# Patient Record
Sex: Male | Born: 1965 | Race: Black or African American | Hispanic: No | Marital: Single | Smoking: Never smoker
Health system: Southern US, Community
[De-identification: ages and names within clinical notes are randomized; demographics above are authoritative.]

---

## 2004-07-22 ENCOUNTER — Emergency Department (HOSPITAL_COMMUNITY): Admission: EM | Admit: 2004-07-22 | Discharge: 2004-07-22 | Payer: Self-pay | Admitting: Emergency Medicine

## 2012-09-09 ENCOUNTER — Encounter (HOSPITAL_COMMUNITY): Payer: Self-pay | Admitting: *Deleted

## 2012-09-09 ENCOUNTER — Emergency Department (HOSPITAL_COMMUNITY): Payer: Self-pay

## 2012-09-09 ENCOUNTER — Emergency Department (HOSPITAL_COMMUNITY)
Admission: EM | Admit: 2012-09-09 | Discharge: 2012-09-09 | Disposition: A | Discharge status: Home / Self Care | Payer: Self-pay | Attending: Emergency Medicine | Admitting: Emergency Medicine

## 2012-09-09 DIAGNOSIS — IMO0001 Reserved for inherently not codable concepts without codable children: Secondary | ICD-10-CM | POA: Insufficient documentation

## 2012-09-09 DIAGNOSIS — M25562 Pain in left knee: Secondary | ICD-10-CM

## 2012-09-09 DIAGNOSIS — M25569 Pain in unspecified knee: Secondary | ICD-10-CM | POA: Insufficient documentation

## 2012-09-09 MED ORDER — ONDANSETRON 4 MG PO TBDP
8.0000 mg | ORAL_TABLET | Freq: Once | ORAL | Status: AC
  Administered 2012-09-09: 8 mg via ORAL
  Filled 2012-09-09: qty 2

## 2012-09-09 MED ORDER — PROMETHAZINE HCL 25 MG PO TABS: 25.0000 mg | ORAL_TABLET | Freq: Four times a day (QID) | ORAL | Status: AC | PRN

## 2012-09-09 MED ORDER — HYDROCODONE-ACETAMINOPHEN 5-325 MG PO TABS
2.0000 | ORAL_TABLET | Freq: Once | ORAL | Status: AC
  Administered 2012-09-09: 2 via ORAL
  Filled 2012-09-09: qty 2

## 2012-09-09 MED ORDER — HYDROCODONE-ACETAMINOPHEN 5-325 MG PO TABS: 2.0000 | ORAL_TABLET | Freq: Four times a day (QID) | ORAL | Status: DC | PRN

## 2012-09-09 NOTE — ED Provider Notes (Signed)
CSN: [id number]     Arrival date & time 09/09/12  0558 History     First MD Initiated Contact with Patient 09/09/12 [phone]     Chief Complaint  Patient presents with  . Knee Pain   (Consider location/radiation/quality/duration/timing/severity/associated sxs/prior Treatment) HPI Comments: Patient is a 47 year old male who presents today with left knee pain since yesterday. He describes the pain as as if someone was hitting him in the knee with a hammer. There was no injury to his knee. 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 Has been ambulatory, but walking increases his pain. He has been icing his knee with no relief. He is never pain like this in the past. He denies fever, chills, nausea, vomiting, abdominal pain, numbness, weakness, paresthesias.  Patient is a 47 y.o. male presenting with knee pain. The history is provided by the patient. No language interpreter was used.  Knee Pain Associated symptoms: no fever     History reviewed. No pertinent past medical history. History reviewed. No pertinent past surgical history. History reviewed. No pertinent family history. History  Substance Use Topics  . Smoking status: Never Smoker   . Smokeless tobacco: Never Used  . Alcohol Use: Yes    Review of Systems  Constitutional: Negative for fever and chills.  Respiratory: Negative for shortness of breath.   Gastrointestinal: Negative for nausea, vomiting and abdominal pain.  Musculoskeletal: Positive for myalgias, arthralgias and gait problem.  Skin: Negative for rash.  All other systems reviewed and are negative.    Allergies  Review of patient's allergies indicates no known allergies.  Home Medications  No current outpatient prescriptions on file. BP 127/85  Pulse 97  Temp(Src) 98.6 F (37 C) (Oral)  Resp 18  SpO2 97% Physical Exam  Nursing note and vitals reviewed. Constitutional: He is oriented to person, place, and  time. He appears well-developed and well-nourished. No distress.  HENT:  Head: Normocephalic and atraumatic.  Right Ear: External ear normal.  Left Ear: External ear normal.  Nose: Nose normal.  Eyes: Conjunctivae are normal.  Neck: Normal range of motion. No tracheal deviation present.  Cardiovascular: Normal rate, regular rhythm and normal heart sounds.   Pulmonary/Chest: Effort normal and breath sounds normal. No stridor.  Abdominal: Soft. He exhibits no distension. There is no tenderness.  Musculoskeletal:       Left knee: He exhibits decreased range of motion. He exhibits no swelling, no effusion, no ecchymosis, no deformity and no erythema. Tenderness found. Patellar tendon tenderness noted.  Neurovascularly intact. Compartment soft. No erythema. Range of motion limited due to pain. Joint stable.   Neurological: He is alert and oriented to person, place, and time.  Skin: Skin is warm and dry. He is not diaphoretic.  Psychiatric: He has a normal mood and affect. His behavior is normal.    ED Course   Procedures (including critical care time)  Labs Reviewed - No data to display Dg Knee Complete 4 Views Left  09/09/2012   *RADIOLOGY REPORT*  Clinical Data: Anterior knee pain  LEFT KNEE - COMPLETE 4+ VIEW  Comparison: None available  Findings: There is no acute fracture or dislocation.  No joint effusion.  Mild narrowing of the medial joint space compartment is present.  Prominent spurring is seen at the superior and inferior poles of the patella.  No chondrocalcinosis.  No soft tissue abnormality.  Osseous mineralization is within normal limits.  IMPRESSION: 1.  No  acute fracture dislocation. 2.  Superior and inferior patellar spurs.   Original Report Authenticated By: Rise Mu, M.D.   1. Left knee pain     MDM  Patient presents with knee pain since yesterday. No injury. Neurovascularly intact. Compartment soft. No concern for septic joint. XR negative for fracture.  Discussed it is likely the superior and inferior patellar spurs are causing his pain. Will give crutches for comfort. Continue to rest, ice, elevate. Small rx for pain medication given. Follow up with ortho as needed. Resource guide given to establish care with pcp. Return instructions given. Vital signs stable for discharge. Patient / Family / Caregiver informed of clinical course, understand medical decision-making process, and agree with plan.   Mora Bellman, PA-C 09/09/12 [phone]

## 2012-09-09 NOTE — ED Provider Notes (Signed)
Medical screening examination/treatment/procedure(s) were performed by non-physician practitioner and as supervising physician I was immediately available for consultation/collaboration.   Sherrill Buikema M Kynnedy Carreno, MD 09/09/12 1638 

## 2012-09-09 NOTE — ED Notes (Signed)
Pt c/o left knee pain. Pt denies any falls, injury/trauma. Pt walking with a limp to pt room.

## 2014-02-03 ENCOUNTER — Emergency Department (HOSPITAL_COMMUNITY)
Admission: EM | Admit: 2014-02-03 | Discharge: 2014-02-03 | Disposition: A | Discharge status: Home / Self Care | Payer: Self-pay | Attending: Emergency Medicine | Admitting: Emergency Medicine

## 2014-02-03 ENCOUNTER — Encounter (HOSPITAL_COMMUNITY): Payer: Self-pay | Admitting: Emergency Medicine

## 2014-02-03 DIAGNOSIS — K0521 Aggressive periodontitis, localized: Secondary | ICD-10-CM | POA: Insufficient documentation

## 2014-02-03 DIAGNOSIS — K05219 Aggressive periodontitis, localized, unspecified severity: Secondary | ICD-10-CM

## 2014-02-03 MED ORDER — HYDROCODONE-ACETAMINOPHEN 5-325 MG PO TABS: 1.0000 | ORAL_TABLET | ORAL | Status: AC | PRN

## 2014-02-03 MED ORDER — PENICILLIN V POTASSIUM 250 MG PO TABS
500.0000 mg | ORAL_TABLET | Freq: Once | ORAL | Status: AC
  Administered 2014-02-03: 500 mg via ORAL
  Filled 2014-02-03: qty 2

## 2014-02-03 MED ORDER — HYDROCODONE-ACETAMINOPHEN 5-325 MG PO TABS
2.0000 | ORAL_TABLET | Freq: Once | ORAL | Status: AC
  Administered 2014-02-03: 2 via ORAL
  Filled 2014-02-03: qty 2

## 2014-02-03 MED ORDER — LIDOCAINE-EPINEPHRINE (PF) 2 %-1:200000 IJ SOLN
20.0000 mL | Freq: Once | INTRAMUSCULAR | Status: AC
  Administered 2014-02-03: 20 mL
  Filled 2014-02-03: qty 20

## 2014-02-03 MED ORDER — PENICILLIN V POTASSIUM 500 MG PO TABS: 500.0000 mg | ORAL_TABLET | Freq: Four times a day (QID) | ORAL | Status: AC

## 2014-02-03 MED ORDER — PREDNISONE 20 MG PO TABS
60.0000 mg | ORAL_TABLET | Freq: Once | ORAL | Status: AC
  Administered 2014-02-03: 60 mg via ORAL
  Filled 2014-02-03: qty 3

## 2014-02-03 NOTE — ED Provider Notes (Signed)
CSN: [id number]     Arrival date & time 02/03/14  [id number] History   First MD Initiated Contact with Patient 02/03/14 [phone]     Chief Complaint  Patient presents with  . Dental Pain    possible abscess      HPI  Patient presents evaluation of dental pain and cheek and gum swelling. He has a left first premolar that is painful. Feels like it is loose. Yesterday felt a little bit of discomfort at the base of tooth. This morning his face was swollen and he presents here.  History reviewed. No pertinent past medical history. History reviewed. No pertinent past surgical history. No family history on file. History  Substance Use Topics  . Smoking status: Never Smoker   . Smokeless tobacco: Never Used  . Alcohol Use: Yes     Comment: socially    Review of Systems  Constitutional: Negative for fever, chills, diaphoresis, appetite change and fatigue.  HENT: Positive for dental problem and facial swelling. Negative for mouth sores, sore throat and trouble swallowing.   Eyes: Negative for visual disturbance.  Respiratory: Negative for cough, chest tightness, shortness of breath and wheezing.   Cardiovascular: Negative for chest pain.  Gastrointestinal: Negative for nausea, vomiting, abdominal pain, diarrhea and abdominal distention.  Endocrine: Negative for polydipsia, polyphagia and polyuria.  Genitourinary: Negative for dysuria, frequency and hematuria.  Musculoskeletal: Negative for gait problem.  Skin: Negative for color change, pallor and rash.  Neurological: Negative for dizziness, syncope, light-headedness and headaches.  Hematological: Does not bruise/bleed easily.  Psychiatric/Behavioral: Negative for behavioral problems and confusion.      Allergies  Review of patient's allergies indicates no known allergies.  Home Medications   Prior to Admission medications   Medication Sig Start Date End Date Taking? Authorizing Provider  Aspirin-Caffeine (BC FAST PAIN RELIEF PO) Take  1 packet by mouth as needed (dental pain).   Yes Historical Provider, MD  HYDROcodone-acetaminophen (NORCO/VICODIN) 5-325 MG per tablet Take 1 tablet by mouth every 4 (four) hours as needed. 02/03/14   Rolland PorterMark Alexsander Cavins, MD  penicillin v potassium (VEETID) 500 MG tablet Take 1 tablet (500 mg total) by mouth 4 (four) times daily. 02/03/14   Rolland PorterMark Lauretta Sallas, MD  promethazine (PHENERGAN) 25 MG tablet Take 1 tablet (25 mg total) by mouth every 6 (six) hours as needed for nausea. Patient not taking: Reported on 02/03/2014 09/09/12   Ramon DredgeHannah S Merrell, PA-C   BP 103/47 mmHg  Pulse 110  Temp(Src) 99.3 F (37.4 C) (Oral)  Resp 14  Ht 6\' 2"  (1.88 m)  Wt 255 lb (115.667 kg)  BMI 32.73 kg/m2  SpO2 95% Physical Exam  Constitutional: He is oriented to person, place, and time. He appears well-developed and well-nourished. No distress.  HENT:  Head: Normocephalic.    Mouth/Throat:    Eyes: Conjunctivae are normal. Pupils are equal, round, and reactive to light. No scleral icterus.  Neck: Normal range of motion. Neck supple. No thyromegaly present.  Cardiovascular: Normal rate and regular rhythm.  Exam reveals no gallop and no friction rub.   No murmur heard. Pulmonary/Chest: Effort normal and breath sounds normal. No respiratory distress. He has no wheezes. He has no rales.  Abdominal: Soft. Bowel sounds are normal. He exhibits no distension. There is no tenderness. There is no rebound.  Musculoskeletal: Normal range of motion.  Neurological: He is alert and oriented to person, place, and time.  Skin: Skin is warm and dry. No rash noted.  Psychiatric: He has  a normal mood and affect. His behavior is normal.    ED Course  INCISION AND DRAINAGE Date/Time: 02/03/2014 10:54 AM Performed by: Rolland PorterJAMES, Datha Kissinger Authorized by: Rolland PorterJAMES, Alexsandro Salek Consent: Verbal consent obtained. Written consent not obtained. Risks and benefits: risks, benefits and alternatives were discussed Consent given by: patient Patient understanding:  patient states understanding of the procedure being performed Patient identity confirmed: verbally with patient Time out: Immediately prior to procedure a "time out" was called to verify the correct patient, procedure, equipment, support staff and site/side marked as required. Type: abscess Body area: head/neck (periodontal) Anesthesia: local infiltration (Inferior alveolar nerve block) Local anesthetic: lidocaine 2% with epinephrine Anesthetic total: 6 ml Patient sedated: no Risk factor: underlying major vessel Needle gauge: 18 Complexity: simple Drainage: purulent Drainage amount: scant Wound treatment: wound left open Patient tolerance: Patient tolerated the procedure well with no immediate complications   (including critical care time) Labs Review Labs Reviewed - No data to display  Imaging Review No results found.   EKG Interpretation None      MDM   Final diagnoses:  Gingival abscess    Scant purulent drainage from the abscess. Patient is continuing with swish and spit. Plan is penicillin pain control. I've asked him to recheck within 48 hours if not markedly improving. Return anytime with any worsening.    Rolland PorterMark Kassadi Presswood, MD 02/03/14 1055

## 2014-02-03 NOTE — ED Notes (Signed)
Patient states he started having dental pain last night and started having swelling to L lower mouth/jaw.   Patient states has loose tooth.

## 2014-04-26 IMAGING — CR DG KNEE COMPLETE 4+V*L*
5 series · 5 of 5 positions shown · non-contrast
Comparison: None available

CLINICAL DATA: Anterior knee pain

LEFT KNEE - COMPLETE 4+ VIEW

[t knee ap left]
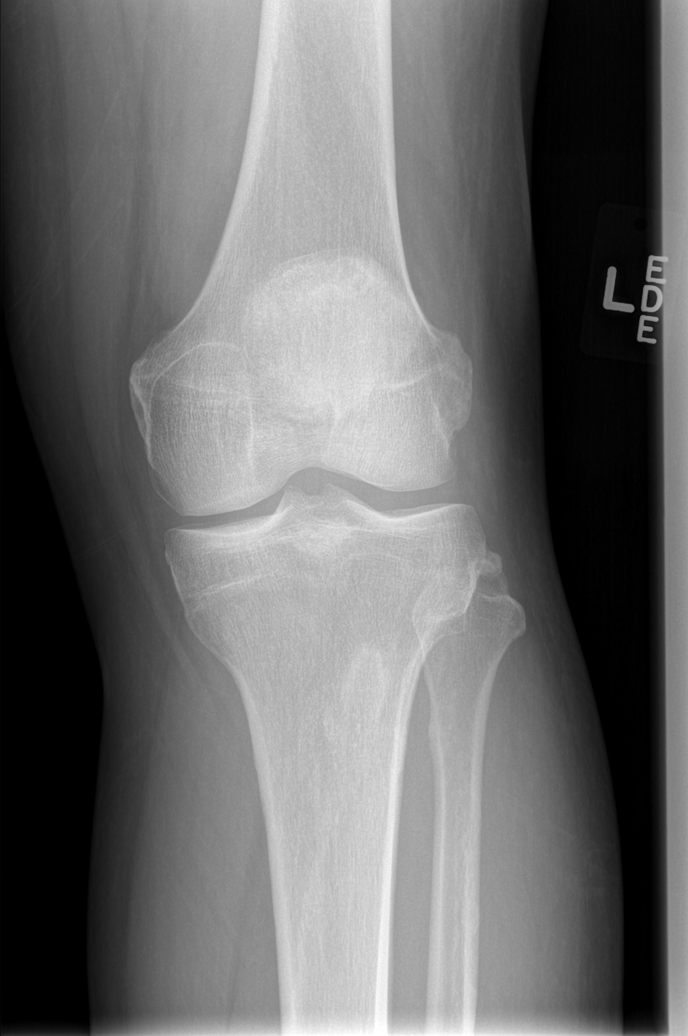

[t knee oblique left (1 of 3)]
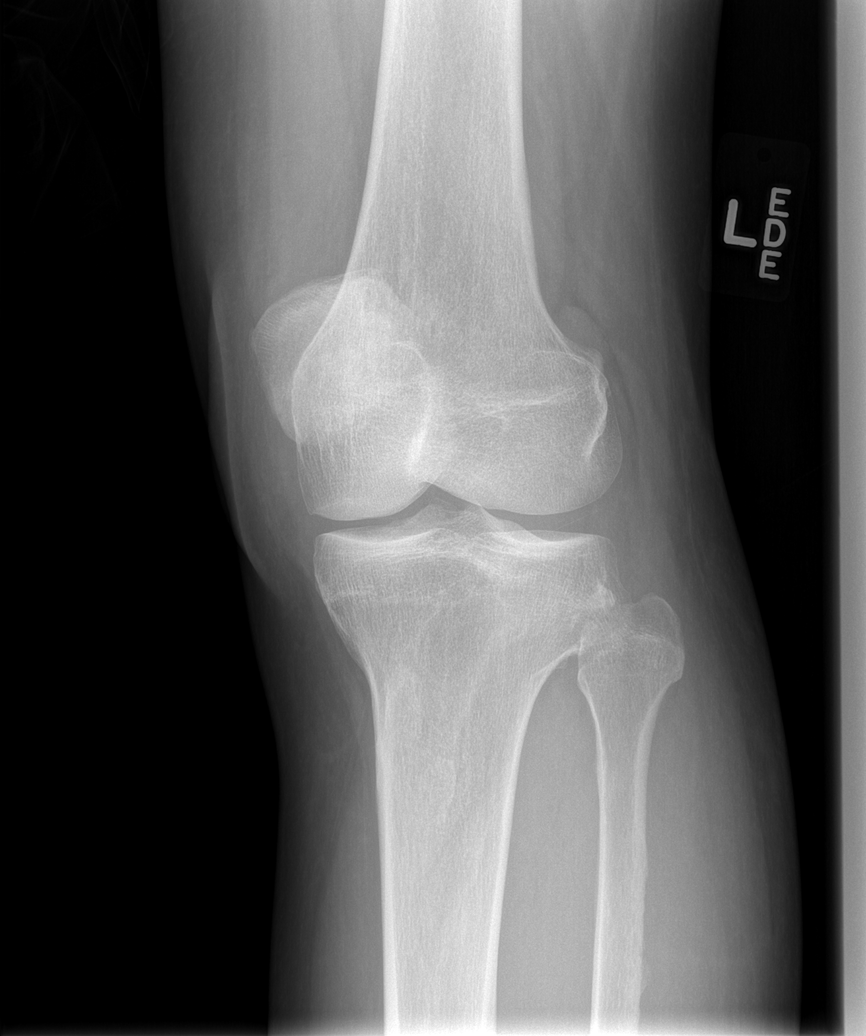

[t knee oblique left (2 of 3)]
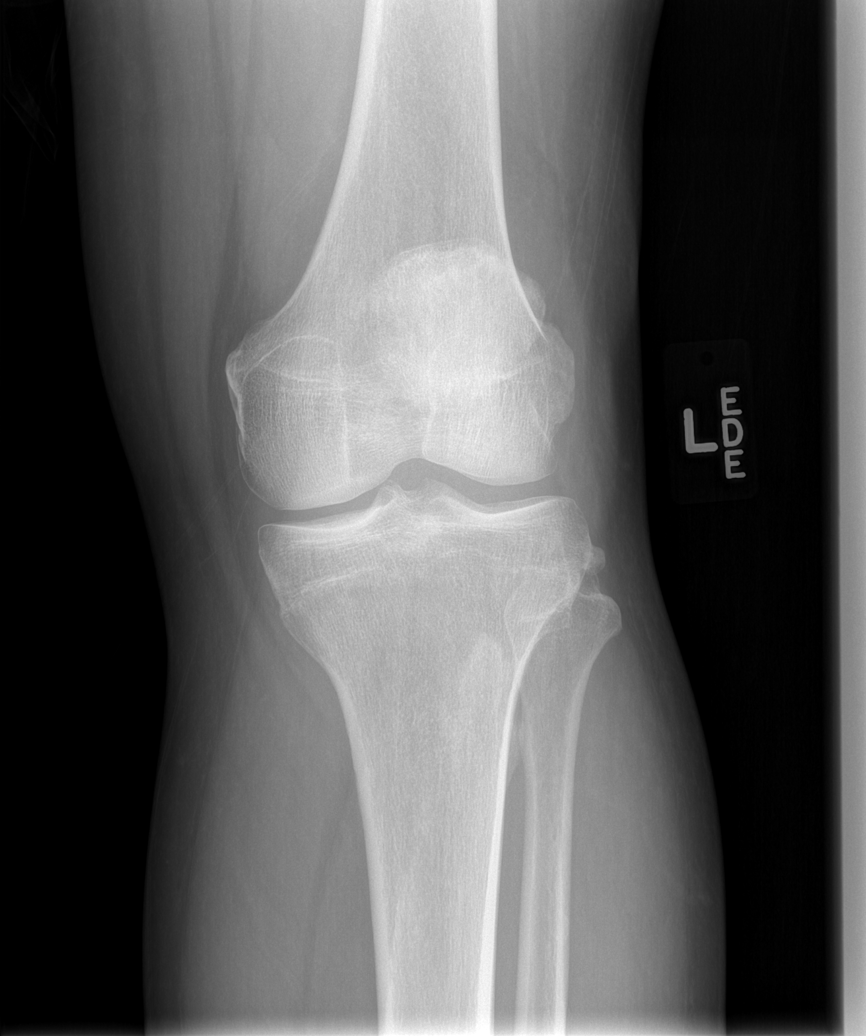

[t knee oblique left (3 of 3)]
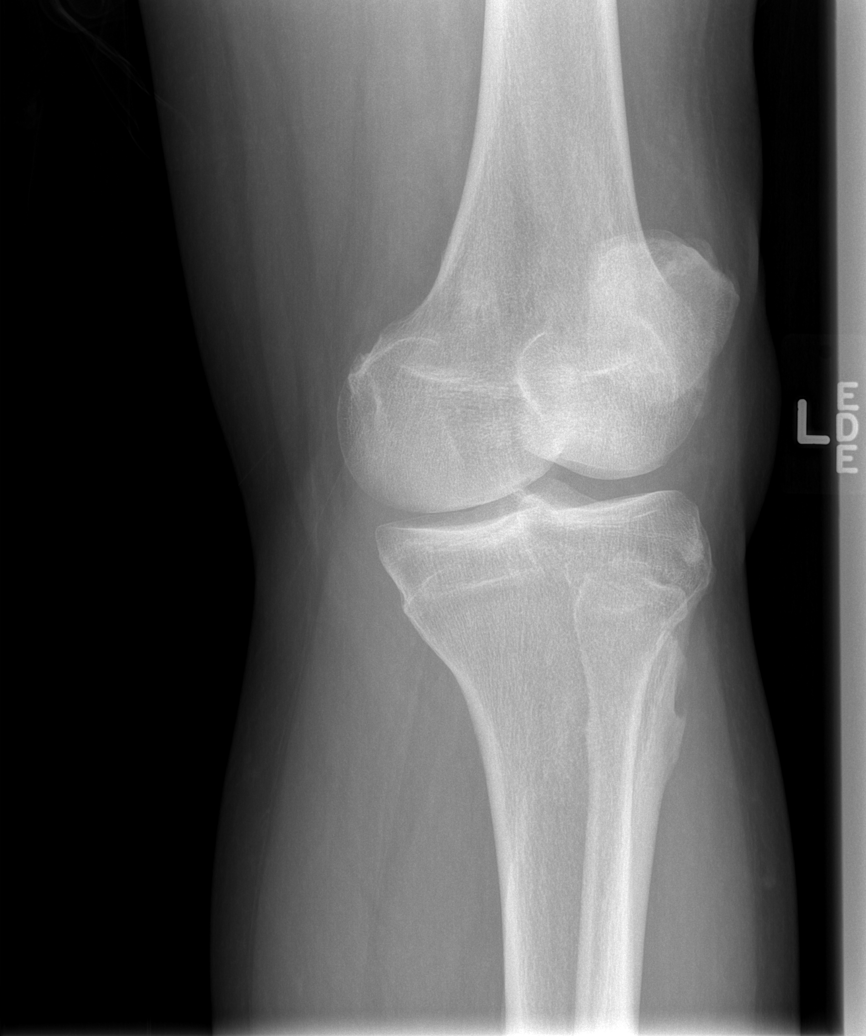

[t knee lat left]
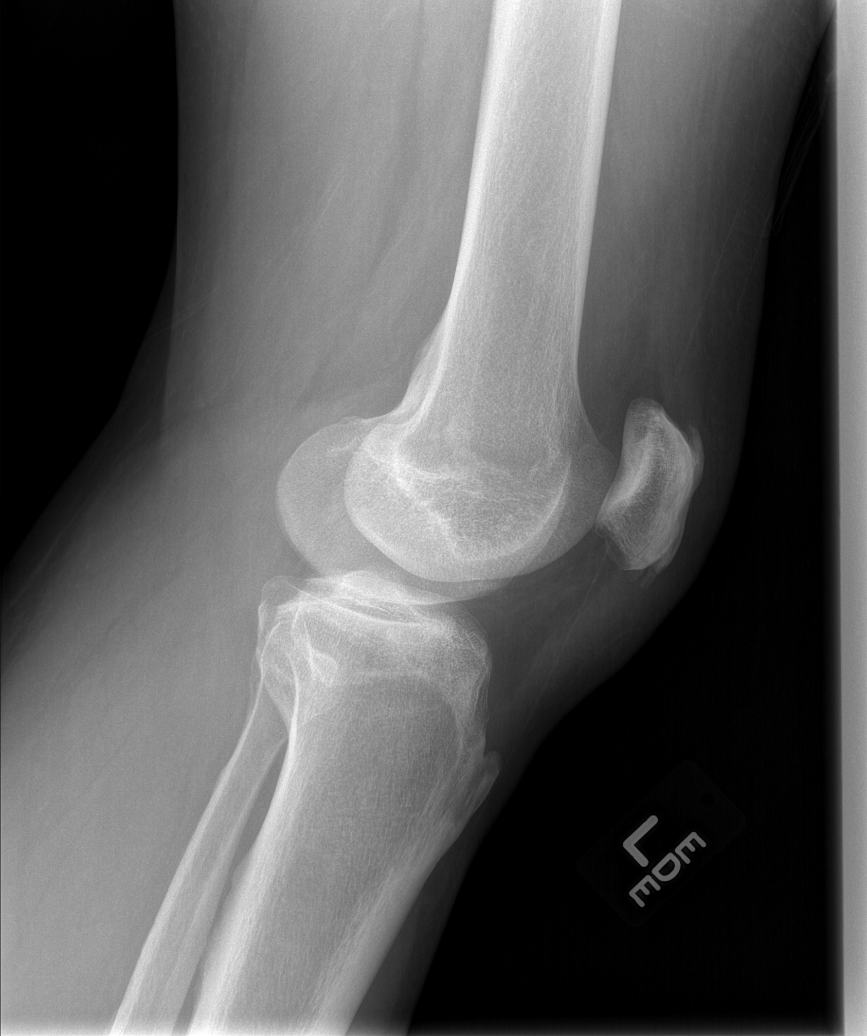

[5 of 5 positions shown; findings below may reference images not displayed]

FINDINGS: There is no acute fracture or dislocation.  No joint
effusion.  Mild narrowing of the medial joint space compartment is
present.  Prominent spurring is seen at the superior and inferior
poles of the patella.  No chondrocalcinosis.  No soft tissue
abnormality.  Osseous mineralization is within normal limits.
IMPRESSION: 1.  No acute fracture dislocation.
2.  Superior and inferior patellar spurs.

## 2023-09-08 ENCOUNTER — Encounter (HOSPITAL_COMMUNITY): Payer: Self-pay

## 2023-09-08 ENCOUNTER — Ambulatory Visit (HOSPITAL_COMMUNITY): Admission: EM | Admit: 2023-09-08 | Discharge: 2023-09-08 | Disposition: A | Discharge status: Home / Self Care

## 2023-09-08 ENCOUNTER — Other Ambulatory Visit: Payer: Self-pay

## 2023-09-08 ENCOUNTER — Emergency Department (HOSPITAL_COMMUNITY)

## 2023-09-08 ENCOUNTER — Emergency Department (HOSPITAL_COMMUNITY)
Admission: EM | Admit: 2023-09-08 | Discharge: 2023-09-08 | Disposition: A | Discharge status: Home / Self Care | Source: Ambulatory Visit

## 2023-09-08 DIAGNOSIS — M25562 Pain in left knee: Secondary | ICD-10-CM | POA: Insufficient documentation

## 2023-09-08 DIAGNOSIS — M25462 Effusion, left knee: Secondary | ICD-10-CM | POA: Diagnosis not present

## 2023-09-08 DIAGNOSIS — Z79899 Other long term (current) drug therapy: Secondary | ICD-10-CM | POA: Diagnosis not present

## 2023-09-08 DIAGNOSIS — R238 Other skin changes: Secondary | ICD-10-CM

## 2023-09-08 DIAGNOSIS — M25469 Effusion, unspecified knee: Secondary | ICD-10-CM

## 2023-09-08 DIAGNOSIS — M7652 Patellar tendinitis, left knee: Secondary | ICD-10-CM | POA: Diagnosis not present

## 2023-09-08 DIAGNOSIS — I10 Essential (primary) hypertension: Secondary | ICD-10-CM | POA: Insufficient documentation

## 2023-09-08 LAB — CBC WITH DIFFERENTIAL/PLATELET
Abs Immature Granulocytes: 0.04 K/uL (ref 0.00–0.07)
Basophils Absolute: 0 K/uL (ref 0.0–0.1)
Basophils Relative: 0 %
Eosinophils Absolute: 0.1 K/uL (ref 0.0–0.5)
Eosinophils Relative: 1 %
HCT: 39.1 % (ref 39.0–52.0)
Hemoglobin: 13.1 g/dL (ref 13.0–17.0)
Immature Granulocytes: 0 %
Lymphocytes Relative: 16 %
Lymphs Abs: 1.6 K/uL (ref 0.7–4.0)
MCH: 32.2 pg (ref 26.0–34.0)
MCHC: 33.5 g/dL (ref 30.0–36.0)
MCV: 96.1 fL (ref 80.0–100.0)
Monocytes Absolute: 0.8 K/uL (ref 0.1–1.0)
Monocytes Relative: 8 %
Neutro Abs: 7.6 K/uL (ref 1.7–7.7)
Neutrophils Relative %: 75 %
Platelets: 317 K/uL (ref 150–400)
RBC: 4.07 MIL/uL — ABNORMAL LOW (ref 4.22–5.81)
RDW: 12.8 % (ref 11.5–15.5)
WBC: 10.1 K/uL (ref 4.0–10.5)
nRBC: 0 % (ref 0.0–0.2)

## 2023-09-08 LAB — COMPREHENSIVE METABOLIC PANEL WITH GFR
ALT: 21 U/L (ref 0–44)
AST: 20 U/L (ref 15–41)
Albumin: 3.7 g/dL (ref 3.5–5.0)
Alkaline Phosphatase: 74 U/L (ref 38–126)
Anion gap: 10 (ref 5–15)
BUN: 10 mg/dL (ref 6–20)
CO2: 27 mmol/L (ref 22–32)
Calcium: 9.3 mg/dL (ref 8.9–10.3)
Chloride: 102 mmol/L (ref 98–111)
Creatinine, Ser: 0.93 mg/dL (ref 0.61–1.24)
GFR, Estimated: >60 mL/min (ref >60)
Glucose, Bld: 115 mg/dL — ABNORMAL HIGH (ref 70–99)
Potassium: 4.1 mmol/L (ref 3.5–5.1)
Sodium: 139 mmol/L (ref 135–145)
Total Bilirubin: 0.8 mg/dL (ref 0.0–1.2)
Total Protein: 7.8 g/dL (ref 6.5–8.1)

## 2023-09-08 LAB — I-STAT CG4 LACTIC ACID, ED: Lactic Acid, Venous: 1.2 mmol/L (ref 0.5–1.9)

## 2023-09-08 MED ORDER — CEPHALEXIN 500 MG PO CAPS: 500.0000 mg | ORAL_CAPSULE | Freq: Four times a day (QID) | ORAL | 0 refills | Status: AC

## 2023-09-08 MED ORDER — LIDOCAINE HCL (PF) 1 % IJ SOLN
5.0000 mL | Freq: Once | INTRAMUSCULAR | Status: AC
  Administered 2023-09-08: 5 mL
  Filled 2023-09-08: qty 5

## 2023-09-08 NOTE — ED Provider Notes (Signed)
 [facility]    CSN: [id number] Arrival date & time: 09/08/23  1120      History   Chief Complaint Chief Complaint  Patient presents with   Insect Bite    HPI Kristjan Derner. Sotomayor is a 58 y.o. male. Last week on 09/03/23, his L knee became red and painful and swollen. Something like this has happened before - it was a spider bite- but it wasn't this bad. This time it continues to get worse. He initially thought it was another insect bite but he did not see an insect bite him.   HPI  History reviewed. No pertinent past medical history.  There are no active problems to display for this patient.   History reviewed. No pertinent surgical history.     Home Medications    Prior to Admission medications   Medication Sig Start Date End Date Taking? Authorizing Provider  Aspirin-Caffeine (BC FAST PAIN RELIEF PO) Take 1 packet by mouth as needed (dental pain).    [provider]  HYDROcodone -acetaminophen  (NORCO/VICODIN) 5-325 MG per tablet Take 1 tablet by mouth every 4 (four) hours as needed. 02/03/14   Lynwood Anes, MD  penicillin  v potassium (VEETID) 500 MG tablet Take 1 tablet (500 mg total) by mouth 4 (four) times daily. 02/03/14   Lynwood Anes, MD  promethazine  (PHENERGAN ) 25 MG tablet Take 1 tablet (25 mg total) by mouth every 6 (six) hours as needed for nausea. Patient not taking: Reported on 02/03/2014 09/09/12   Remonia Lacks, PA-C    Family History Family History  Family history unknown: Yes    Social History Social History   Tobacco Use   Smoking status: Never   Smokeless tobacco: Never  Vaping Use   Vaping status: Never Used  Substance Use Topics   Alcohol use: Yes    Comment: socially   Drug use: No     Allergies   Patient has no known allergies.   Review of Systems Review of Systems   Physical Exam Triage Vital Signs ED Triage Vitals [09/08/23 1220]  Encounter Vitals Group     BP (!) 144/88     Girls Systolic BP Percentile       Girls Diastolic BP Percentile      Boys Systolic BP Percentile      Boys Diastolic BP Percentile      Pulse Rate 79     Resp 16     Temp 98.4 F (36.9 C)     Temp Source Oral     SpO2 99 %     Weight      Height      Head Circumference      Peak Flow      Pain Score 3     Pain Loc      Pain Education      Exclude from Growth Chart    No data found.  Updated Vital Signs BP (!) 144/88 (BP Location: Right Arm)   Pulse 79   Temp 98.4 F (36.9 C) (Oral)   Resp 16   SpO2 99%   Visual Acuity Right Eye Distance:   Left Eye Distance:   Bilateral Distance:    Right Eye Near:   Left Eye Near:    Bilateral Near:     Physical Exam Constitutional:      Appearance: Normal appearance.  Pulmonary:     Effort: Pulmonary effort is normal.  Musculoskeletal:     Left knee: Swelling, effusion and erythema  present. Tenderness present.     Comments: L knee is warm  Neurological:     Mental Status: He is alert.      UC Treatments / Results  Labs (all labs ordered are listed, but only abnormal results are displayed) Labs Reviewed - No data to display  EKG   Radiology No results found.  Procedures Procedures (including critical care time)  Medications Ordered in UC Medications - No data to display  Initial Impression / Assessment and Plan / UC Course  I have reviewed the triage vital signs and the nursing notes.  Pertinent labs & imaging results that were available during my care of the patient were reviewed by me and considered in my medical decision making (see chart for details).     I think he needs further eval in ER. Pt is agreeable.  Final Clinical Impressions(s) / UC Diagnoses   Final diagnoses:  Redness and swelling of knee     Discharge Instructions      Please go directly to the ER for further evaluation of your hot, red, swollen knee.    ED Prescriptions   None    PDMP not reviewed this encounter.   Richad Jon HERO, NP 09/08/23  1325

## 2023-09-08 NOTE — ED Notes (Signed)
 Pt gives verbal consent for mse

## 2023-09-08 NOTE — ED Provider Notes (Signed)
 [place] EMERGENCY DEPARTMENT AT [facility] Provider Note   CSN: [id number] Arrival date & time: 09/08/23  1357     Patient presents with: Knee Pain   Eric Brown is a 58 y.o. male.   This is a 58 year old male presenting emergency department for knee pain and swelling.  For close to a week he has had increasing pain and swelling to his right knee.  This has happened to him in the past, but improved and has not had any issues.  No trauma that he can recall.  Pain with ambulation, but is able to ambulate.  No fevers or chills.   Knee Pain      Prior to Admission medications   Medication Sig Start Date End Date Taking? Authorizing Provider  Aspirin-Caffeine (BC FAST PAIN RELIEF PO) Take 1 packet by mouth as needed (dental pain).    [provider]  HYDROcodone -acetaminophen  (NORCO/VICODIN) 5-325 MG per tablet Take 1 tablet by mouth every 4 (four) hours as needed. 02/03/14   Lynwood Anes, MD  penicillin  v potassium (VEETID) 500 MG tablet Take 1 tablet (500 mg total) by mouth 4 (four) times daily. 02/03/14   Lynwood Anes, MD  promethazine  (PHENERGAN ) 25 MG tablet Take 1 tablet (25 mg total) by mouth every 6 (six) hours as needed for nausea. Patient not taking: Reported on 02/03/2014 09/09/12   Remonia Lacks, PA-C    Allergies: Patient has no known allergies.    Review of Systems  Updated Vital Signs BP (!) 167/96 (BP Location: Left Arm)   Pulse (!) 105   Temp 98.6 F (37 C)   Resp 16   SpO2 99%   Physical Exam Vitals and nursing note reviewed.  Constitutional:      General: He is not in acute distress.    Appearance: He is not toxic-appearing.  HENT:     Head: Normocephalic.     Mouth/Throat:     Mouth: Mucous membranes are moist.  Eyes:     Conjunctiva/sclera: Conjunctivae normal.  Cardiovascular:     Rate and Rhythm: Normal rate and regular rhythm.  Pulmonary:     Effort: Pulmonary effort is normal.  Abdominal:     General: Abdomen is  flat. There is no distension.     Palpations: Abdomen is soft.     Tenderness: There is no abdominal tenderness. There is no guarding or rebound.  Musculoskeletal:     Comments: Left knee with some global swelling, some minor erythema to the anterior suprapatella region.  Mildly tender to palpation.  Able to range knee, with some minor discomfort.  Soft compartments.  Warm and well-perfused limb otherwise.  2+ DP pulses  Skin:    Capillary Refill: Capillary refill takes less than 2 seconds.  Neurological:     Mental Status: He is alert and oriented to person, place, and time.  Psychiatric:        Mood and Affect: Mood normal.        Behavior: Behavior normal.     (all labs ordered are listed, but only abnormal results are displayed) Labs Reviewed  COMPREHENSIVE METABOLIC PANEL WITH GFR - Abnormal; Notable for the following components:      Result Value   Glucose, Bld 115 (*)    All other components within normal limits  CBC WITH DIFFERENTIAL/PLATELET - Abnormal; Notable for the following components:   RBC 4.07 (*)    All other components within normal limits  I-STAT CG4 LACTIC ACID, ED  EKG: None  Radiology: DG Knee 2 Views Left Result Date: 09/08/2023 CLINICAL DATA:  Knee pain with redness swelling EXAM: LEFT KNEE - 1-2 VIEW COMPARISON:  09/09/2012 FINDINGS: No fracture or malalignment. Enthesophytes at the superior and inferior patella. Trace knee effusion. Joint spaces are relatively patent. IMPRESSION: No acute osseous abnormality. Trace knee effusion. Electronically Signed   By: Luke Bun M.D.   On: 09/08/2023 19:35     .Joint Aspiration/Arthrocentesis  Date/Time: 09/08/2023 7:44 PM  Performed by: Neysa Caron PARAS, DO Authorized by: Neysa Caron PARAS, DO   Consent:    Consent obtained:  Verbal   Consent given by:  Patient   Risks discussed:  Bleeding, infection, pain and incomplete drainage   Alternatives discussed:  No treatment Universal protocol:    Procedure  explained and questions answered to patient or proxy's satisfaction: yes     Patient identity confirmed:  Verbally with patient Location:    Location:  Knee   Knee:  L knee Anesthesia:    Anesthesia method:  Local infiltration   Local anesthetic:  Lidocaine  1% w/o epi Procedure details:    Preparation: Patient was prepped and draped in usual sterile fashion     Needle gauge:  22 G   Approach:  Medial   Aspirate amount:  0 Post-procedure details:    Procedure completion:  Tolerated    Medications Ordered in the ED  lidocaine  (PF) (XYLOCAINE ) 1 % injection 5 mL (5 mLs Other Given 09/08/23 1909)    Clinical Course as of 09/08/23 1945  Mon Sep 08, 2023  1827 WBC: 10.1 [TY]  1828 Lactic Acid, Venous: 1.2 [TY]  1828 Comprehensive metabolic panel(!) No significant metabolic derangements. [TY]    Clinical Course User Index [TY] Neysa Caron PARAS, DO                                 Medical Decision Making This a 58 year old male presenting emergency department with left knee swelling reports a history of gout in his toes he is afebrile, mildly elevated heart rate, hypertensive.  Physical exam with some minor erythematous changes to the suprapatella aspect of the knee with some diffuse swelling.  Attempted knee tap based on clinical appearance, however unable to get fluid.  Follow-up x-ray with essentially no effusion, which would make septic joint less likely. No other osseous abnormality.  I suspect cellulitis vs gout as he has no fever, no leukocytosis no elevated lactate and has a history of gout.  His comprehensive panel reassuring with normal kidney function.  However, he has never had gout in his knee before.  Will cover with antibiotics for cellulitis, and discussed supportive medications in case symptoms are secondary to gout.  Stable for discharge.  Given strict return precautions.  Amount and/or Complexity of Data Reviewed Labs: ordered. Decision-making details documented in ED  Course. Radiology: ordered.  Risk Prescription drug management.      Final diagnoses:  None    ED Discharge Orders     None          Neysa Caron PARAS, DO 09/08/23 1945

## 2023-09-08 NOTE — ED Triage Notes (Signed)
 Patient states he got bitten by something on his left knee. Patient has redness and swelling present.  Patient states he has used Icy hot and applied ice with no relief.

## 2023-09-08 NOTE — Discharge Instructions (Signed)
 Please go directly to the ER for further evaluation of your hot, red, swollen knee.

## 2023-09-08 NOTE — Discharge Instructions (Addendum)
 You may take Tylenol  alternating with ibuprofen for pain.  We are prescribing you antibiotics.  Please follow-up with your primary doctor.  You can also follow-up with the orthopedic doctor as well.  Return for fevers, chills, severe pain, numbness tingling changes in sensation, or any new or worsening symptoms that are concerning to you.

## 2023-09-08 NOTE — ED Triage Notes (Signed)
 Pt to er, pt states that he was sent here from urgent care, per paper work from urgent care pt is here for redness and swelling to his L knee.  Pt states that he was sent here to get some IV abx.
